# Patient Record
Sex: Male | Born: 1990 | Race: White | Hispanic: No | Marital: Single | Smoking: Never smoker
Health system: Southern US, Community
[De-identification: ages and names within clinical notes are randomized; demographics above are authoritative.]

## PROBLEM LIST (undated history)

## (undated) HISTORY — PX: OTHER SURGICAL HISTORY: SHX169

---
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
## 2011-05-14 ENCOUNTER — Emergency Department (HOSPITAL_BASED_OUTPATIENT_CLINIC_OR_DEPARTMENT_OTHER)
Admission: EM | Admit: 2011-05-14 | Discharge: 2011-05-14 | Discharge status: Against Medical Advice | Payer: Self-pay | Attending: Emergency Medicine | Admitting: Emergency Medicine

## 2011-05-14 DIAGNOSIS — Z0389 Encounter for observation for other suspected diseases and conditions ruled out: Secondary | ICD-10-CM | POA: Insufficient documentation

## 2011-09-17 LAB — I-STAT 8, (EC8 V) (CONVERTED LAB)
BUN: 12
Bicarbonate: 25 — ABNORMAL HIGH
Glucose, Bld: 164 — ABNORMAL HIGH
pCO2, Ven: 47.3
pH, Ven: 7.332 — ABNORMAL HIGH

## 2011-09-17 LAB — POCT I-STAT CREATININE: Creatinine, Ser: 0.8

## 2014-09-30 ENCOUNTER — Emergency Department (HOSPITAL_COMMUNITY)
Admission: EM | Admit: 2014-09-30 | Discharge: 2014-09-30 | Disposition: A | Discharge status: Home / Self Care | Payer: BC Managed Care – PPO | Attending: Emergency Medicine | Admitting: Emergency Medicine

## 2014-09-30 ENCOUNTER — Encounter (HOSPITAL_COMMUNITY): Payer: Self-pay

## 2014-09-30 DIAGNOSIS — T23001A Burn of unspecified degree of right hand, unspecified site, initial encounter: Secondary | ICD-10-CM | POA: Diagnosis present

## 2014-09-30 DIAGNOSIS — Y9289 Other specified places as the place of occurrence of the external cause: Secondary | ICD-10-CM | POA: Insufficient documentation

## 2014-09-30 DIAGNOSIS — T23201A Burn of second degree of right hand, unspecified site, initial encounter: Secondary | ICD-10-CM | POA: Insufficient documentation

## 2014-09-30 DIAGNOSIS — Y9389 Activity, other specified: Secondary | ICD-10-CM | POA: Insufficient documentation

## 2014-09-30 DIAGNOSIS — X030XXA Exposure to flames in controlled fire, not in building or structure, initial encounter: Secondary | ICD-10-CM | POA: Diagnosis not present

## 2014-09-30 DIAGNOSIS — T23131A Burn of first degree of multiple right fingers (nail), not including thumb, initial encounter: Secondary | ICD-10-CM | POA: Diagnosis not present

## 2014-09-30 LAB — BASIC METABOLIC PANEL
Anion gap: 12 (ref 5–15)
BUN: 13 mg/dL (ref 6–23)
CO2: 24 mEq/L (ref 19–32)
CREATININE: 0.97 mg/dL (ref 0.50–1.35)
Calcium: 9 mg/dL (ref 8.4–10.5)
Chloride: 104 mEq/L (ref 96–112)
GLUCOSE: 123 mg/dL — AB (ref 70–99)
POTASSIUM: 4.2 meq/L (ref 3.7–5.3)
Sodium: 140 mEq/L (ref 137–147)

## 2014-09-30 LAB — CBC WITH DIFFERENTIAL/PLATELET
BASOS PCT: 0 % (ref 0–1)
Basophils Absolute: 0 10*3/uL (ref 0.0–0.1)
EOS ABS: 0 10*3/uL (ref 0.0–0.7)
EOS PCT: 0 % (ref 0–5)
HCT: 43.5 % (ref 39.0–52.0)
Hemoglobin: 15.6 g/dL (ref 13.0–17.0)
LYMPHS ABS: 1.1 10*3/uL (ref 0.7–4.0)
Lymphocytes Relative: 15 % (ref 12–46)
MCH: 30.1 pg (ref 26.0–34.0)
MCHC: 35.9 g/dL (ref 30.0–36.0)
MCV: 83.8 fL (ref 78.0–100.0)
MONOS PCT: 3 % (ref 3–12)
Monocytes Absolute: 0.2 10*3/uL (ref 0.1–1.0)
Neutro Abs: 6 10*3/uL (ref 1.7–7.7)
Neutrophils Relative %: 82 % — ABNORMAL HIGH (ref 43–77)
PLATELETS: 245 10*3/uL (ref 150–400)
RBC: 5.19 MIL/uL (ref 4.22–5.81)
RDW: 12.1 % (ref 11.5–15.5)
WBC: 7.3 10*3/uL (ref 4.0–10.5)

## 2014-09-30 MED ORDER — MORPHINE SULFATE 4 MG/ML IJ SOLN
4.0000 mg | Freq: Once | INTRAMUSCULAR | Status: AC
  Administered 2014-09-30: 4 mg via INTRAVENOUS
  Filled 2014-09-30: qty 1

## 2014-09-30 MED ORDER — SILVER SULFADIAZINE 1 % EX CREA: TOPICAL_CREAM | Freq: Every day | CUTANEOUS | Status: DC

## 2014-09-30 MED ORDER — SILVER SULFADIAZINE 1 % EX CREA
TOPICAL_CREAM | Freq: Once | CUTANEOUS | Status: AC
  Administered 2014-09-30: 1 via TOPICAL
  Filled 2014-09-30: qty 85

## 2014-09-30 MED ORDER — SODIUM CHLORIDE 0.9 % IV BOLUS (SEPSIS)
1000.0000 mL | Freq: Once | INTRAVENOUS | Status: AC
  Administered 2014-09-30: 1000 mL via INTRAVENOUS

## 2014-09-30 MED ORDER — OXYCODONE-ACETAMINOPHEN 5-325 MG PO TABS: 1.0000 | ORAL_TABLET | ORAL | Status: DC | PRN

## 2014-09-30 NOTE — ED Notes (Signed)
TRIAGE ASSESSMENT AND FOCUSED ASSESSMENTS CHARTED DURING DOWNTIME.  

## 2014-09-30 NOTE — ED Notes (Signed)
Pt A&OX4, ambulatory at d/c with steady gait, NAD 

## 2014-09-30 NOTE — ED Notes (Signed)
Pt A&Ox4, ambulatory at d/c with steady gait, NAD 
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
## 2014-09-30 NOTE — Discharge Instructions (Signed)
Burn Care Your skin is a natural barrier to infection. It is the largest organ of your body. Burns damage this natural protection. To help prevent infection, it is very important to follow your caregiver's instructions in the care of your burn. Burns are classified as:  First degree. There is only redness of the skin (erythema). No scarring is expected.  Second degree. There is blistering of the skin. Scarring may occur with deeper burns.  Third degree. All layers of the skin are injured, and scarring is expected. HOME CARE INSTRUCTIONS   Wash your hands well before changing your bandage.  Change your bandage as often as directed by your caregiver.  Remove the old bandage. If the bandage sticks, you may soak it off with cool, clean water.  Cleanse the burn thoroughly but gently with mild soap and water.  Pat the area dry with a clean, dry cloth.  Apply a thin layer of antibacterial cream to the burn.  Apply a clean bandage as instructed by your caregiver.  Keep the bandage as clean and dry as possible.  Elevate the affected area for the first 24 hours, then as instructed by your caregiver.  Only take over-the-counter or prescription medicines for pain, discomfort, or fever as directed by your caregiver. SEEK IMMEDIATE MEDICAL CARE IF:   You develop excessive pain.  You develop redness, tenderness, swelling, or red streaks near the burn.  The burned area develops yellowish-white fluid (pus) or a bad smell.  You have a fever. MAKE SURE YOU:   Understand these instructions.  Will watch your condition.  Will get help right away if you are not doing well or get worse. Document Released: 11/16/2005 Document Revised: 02/08/2012 Document Reviewed: 04/08/2011 [employer] Patient Information 2015 [employer]. This information is not intended to replace advice given to you by your health care provider. Make sure you discuss any questions you have with your health care  provider.  

## 2016-09-26 ENCOUNTER — Emergency Department (HOSPITAL_COMMUNITY): Payer: Self-pay

## 2016-09-26 ENCOUNTER — Encounter (HOSPITAL_COMMUNITY): Payer: Self-pay | Admitting: Emergency Medicine

## 2016-09-26 ENCOUNTER — Emergency Department (HOSPITAL_COMMUNITY)
Admission: EM | Admit: 2016-09-26 | Discharge: 2016-09-26 | Disposition: A | Discharge status: Home / Self Care | Payer: Self-pay | Attending: Emergency Medicine | Admitting: Emergency Medicine

## 2016-09-26 DIAGNOSIS — S61216A Laceration without foreign body of right little finger without damage to nail, initial encounter: Secondary | ICD-10-CM | POA: Insufficient documentation

## 2016-09-26 DIAGNOSIS — W010XXA Fall on same level from slipping, tripping and stumbling without subsequent striking against object, initial encounter: Secondary | ICD-10-CM | POA: Insufficient documentation

## 2016-09-26 DIAGNOSIS — Y939 Activity, unspecified: Secondary | ICD-10-CM | POA: Insufficient documentation

## 2016-09-26 DIAGNOSIS — S41111A Laceration without foreign body of right upper arm, initial encounter: Secondary | ICD-10-CM | POA: Insufficient documentation

## 2016-09-26 DIAGNOSIS — S61214A Laceration without foreign body of right ring finger without damage to nail, initial encounter: Secondary | ICD-10-CM | POA: Insufficient documentation

## 2016-09-26 DIAGNOSIS — Y999 Unspecified external cause status: Secondary | ICD-10-CM | POA: Insufficient documentation

## 2016-09-26 DIAGNOSIS — Y9289 Other specified places as the place of occurrence of the external cause: Secondary | ICD-10-CM | POA: Insufficient documentation

## 2016-09-26 MED ORDER — LIDOCAINE HCL (PF) 1 % IJ SOLN
20.0000 mL | Freq: Once | INTRAMUSCULAR | Status: AC
  Administered 2016-09-26: 1 mL
  Filled 2016-09-26: qty 30

## 2016-09-26 MED ORDER — BACITRACIN ZINC 500 UNIT/GM EX OINT: 1.0000 "application " | TOPICAL_OINTMENT | Freq: Two times a day (BID) | CUTANEOUS | 1 refills | Status: AC

## 2016-09-26 MED ORDER — LIDOCAINE-EPINEPHRINE (PF) 2 %-1:200000 IJ SOLN
10.0000 mL | Freq: Once | INTRAMUSCULAR | Status: AC
  Administered 2016-09-26: 1 mL via INTRADERMAL
  Filled 2016-09-26: qty 20

## 2016-09-26 MED ORDER — BACITRACIN ZINC 500 UNIT/GM EX OINT
TOPICAL_OINTMENT | CUTANEOUS | Status: AC
  Filled 2016-09-26: qty 0.9

## 2016-09-26 MED ORDER — NAPROXEN 250 MG PO TABS: 250.0000 mg | ORAL_TABLET | Freq: Two times a day (BID) | ORAL | 0 refills | Status: AC
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
## 2022-03-11 ENCOUNTER — Emergency Department (HOSPITAL_COMMUNITY)
Admission: EM | Admit: 2022-03-11 | Discharge: 2022-03-12 | Disposition: A | Discharge status: Home / Self Care | Payer: Self-pay | Attending: Emergency Medicine | Admitting: Emergency Medicine

## 2022-03-11 ENCOUNTER — Emergency Department (HOSPITAL_COMMUNITY): Payer: Self-pay

## 2022-03-11 DIAGNOSIS — R21 Rash and other nonspecific skin eruption: Secondary | ICD-10-CM | POA: Diagnosis not present

## 2022-03-11 DIAGNOSIS — M25512 Pain in left shoulder: Secondary | ICD-10-CM | POA: Insufficient documentation

## 2022-03-11 DIAGNOSIS — M79641 Pain in right hand: Secondary | ICD-10-CM | POA: Insufficient documentation

## 2022-03-11 DIAGNOSIS — S60031A Contusion of right middle finger without damage to nail, initial encounter: Secondary | ICD-10-CM | POA: Diagnosis not present

## 2022-03-11 DIAGNOSIS — T07XXXA Unspecified multiple injuries, initial encounter: Secondary | ICD-10-CM

## 2022-03-11 DIAGNOSIS — S6991XA Unspecified injury of right wrist, hand and finger(s), initial encounter: Secondary | ICD-10-CM | POA: Diagnosis present

## 2022-03-11 MED ORDER — OXYCODONE-ACETAMINOPHEN 5-325 MG PO TABS
1.0000 | ORAL_TABLET | Freq: Once | ORAL | Status: AC
  Administered 2022-03-11: 1 via ORAL
  Filled 2022-03-11: qty 1
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
## 2022-03-12 ENCOUNTER — Encounter (HOSPITAL_COMMUNITY): Payer: Self-pay

## 2022-03-12 ENCOUNTER — Other Ambulatory Visit: Payer: Self-pay

## 2022-03-12 MED ORDER — OXYCODONE-ACETAMINOPHEN 5-325 MG PO TABS: 1.0000 | ORAL_TABLET | ORAL | 0 refills | Status: AC | PRN

## 2022-03-12 NOTE — ED Notes (Signed)
All discharge instructions including follow up care and prescriptions reviewed with patient and patient verbalized understanding of same. Patient stable and ambulatory at time of discharge.  

## 2022-03-12 NOTE — ED Notes (Signed)
This RN applied finer splint to patients right middle finger. Abrasions to right hand and fingers cleaned with sterile water and soap prior to splint application,. Patients right hand also wrapped in kerlix and non stick gauze applied to abrasions. Patient tolerated well.  ?

## 2022-03-12 NOTE — Discharge Instructions (Signed)
Take the prescribed medication as directed.  Wear sling and splint for now.   ?Follow-up with orthopedics if symptoms not improving-- call office for appt. ?Return to the ED for new or worsening symptoms. ?

## 2022-11-25 IMAGING — DX DG SHOULDER 2+V*L*
3 series · 3 of 3 positions shown · non-contrast
Comparison: None.

CLINICAL DATA: Fall

EXAM:
LEFT SHOULDER - 2+ VIEW

[shoulder grashey]
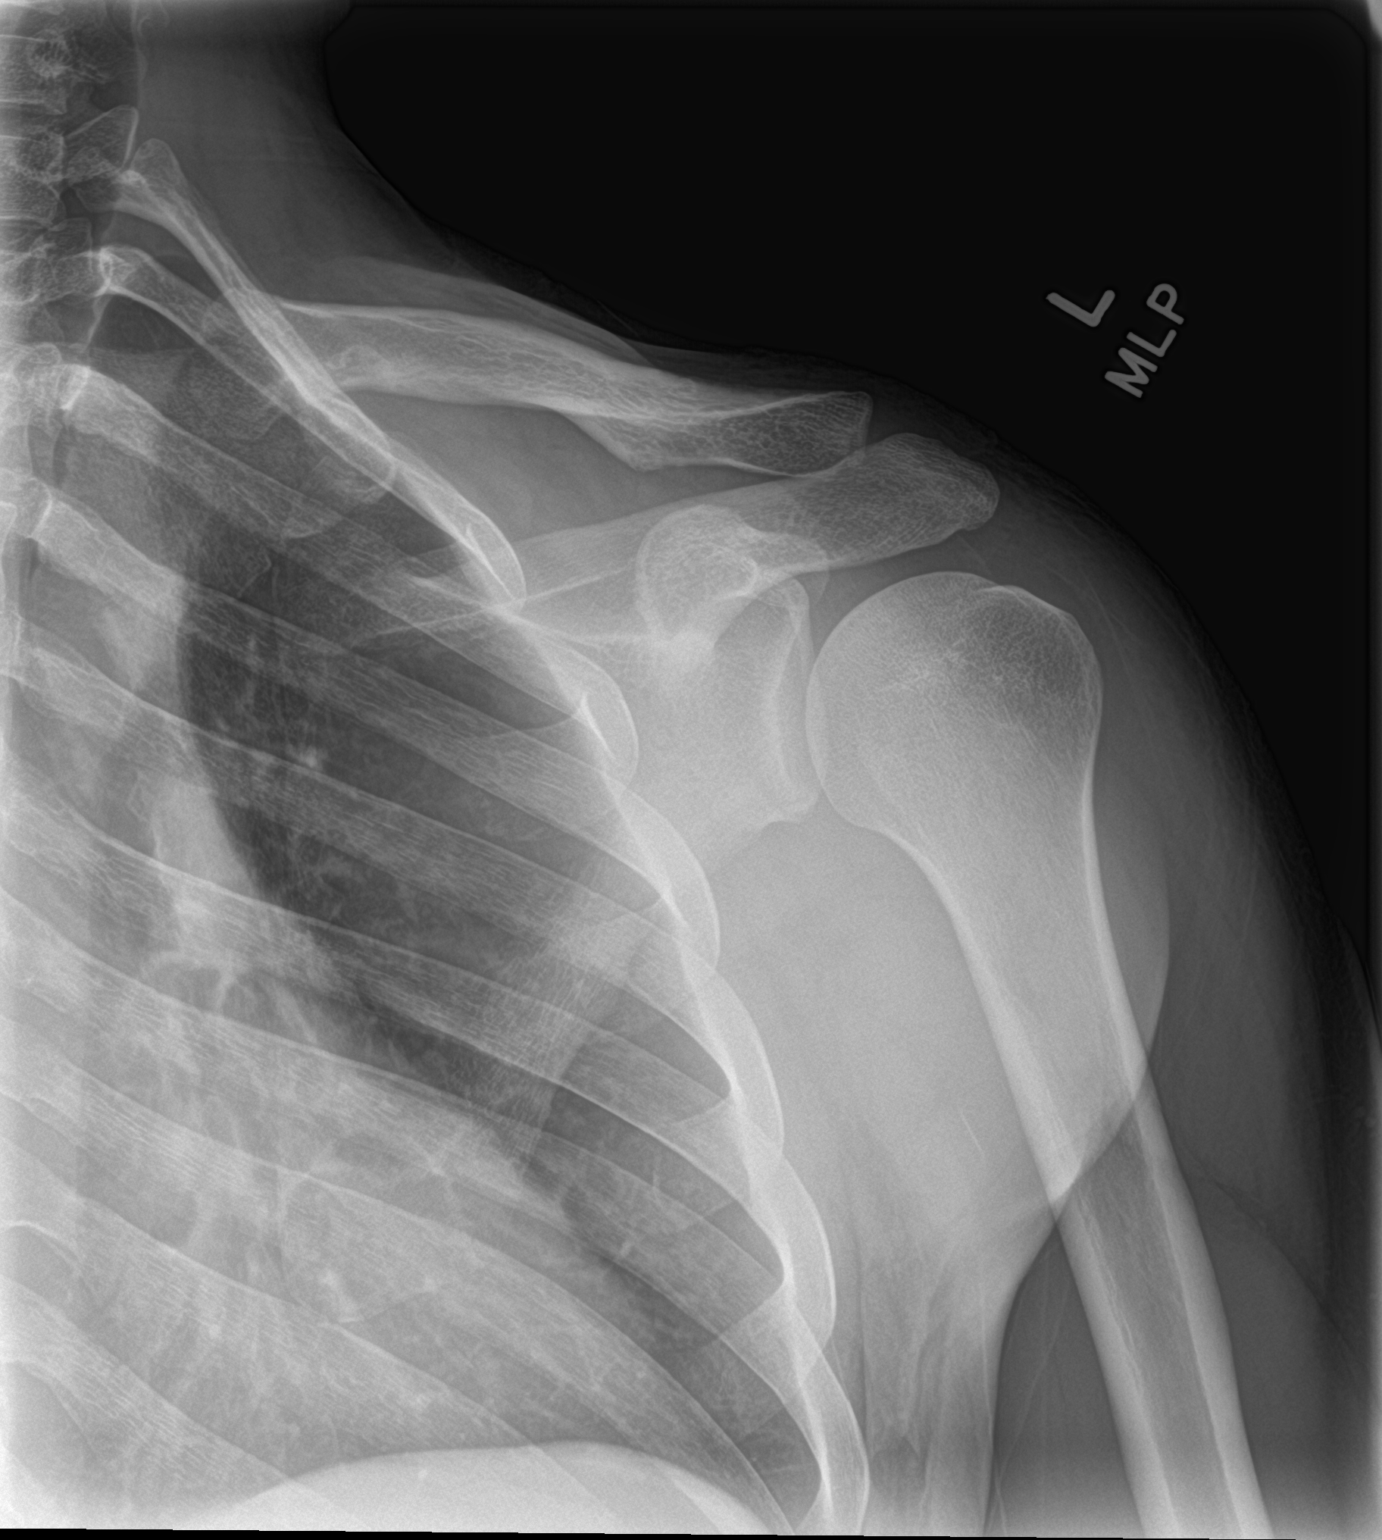

[shoulder y view]
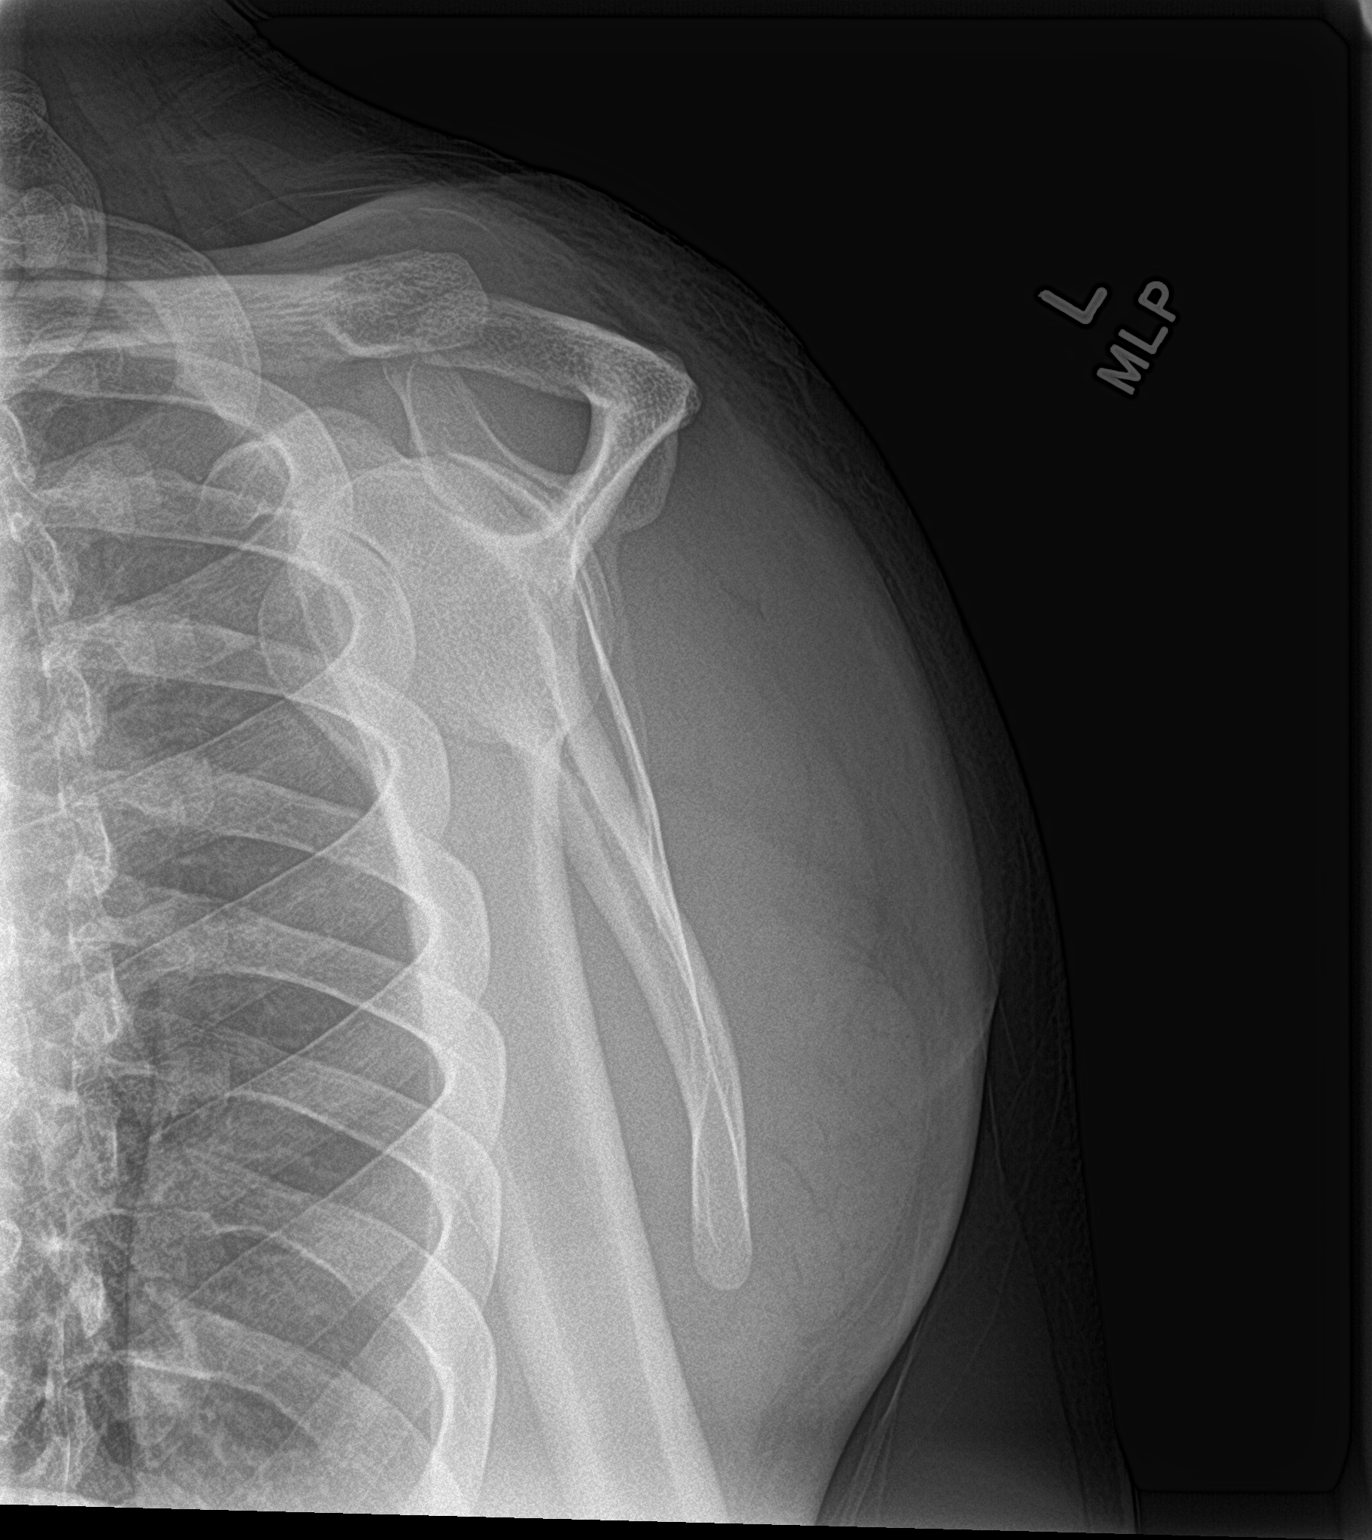

[shoulder ap neutral]
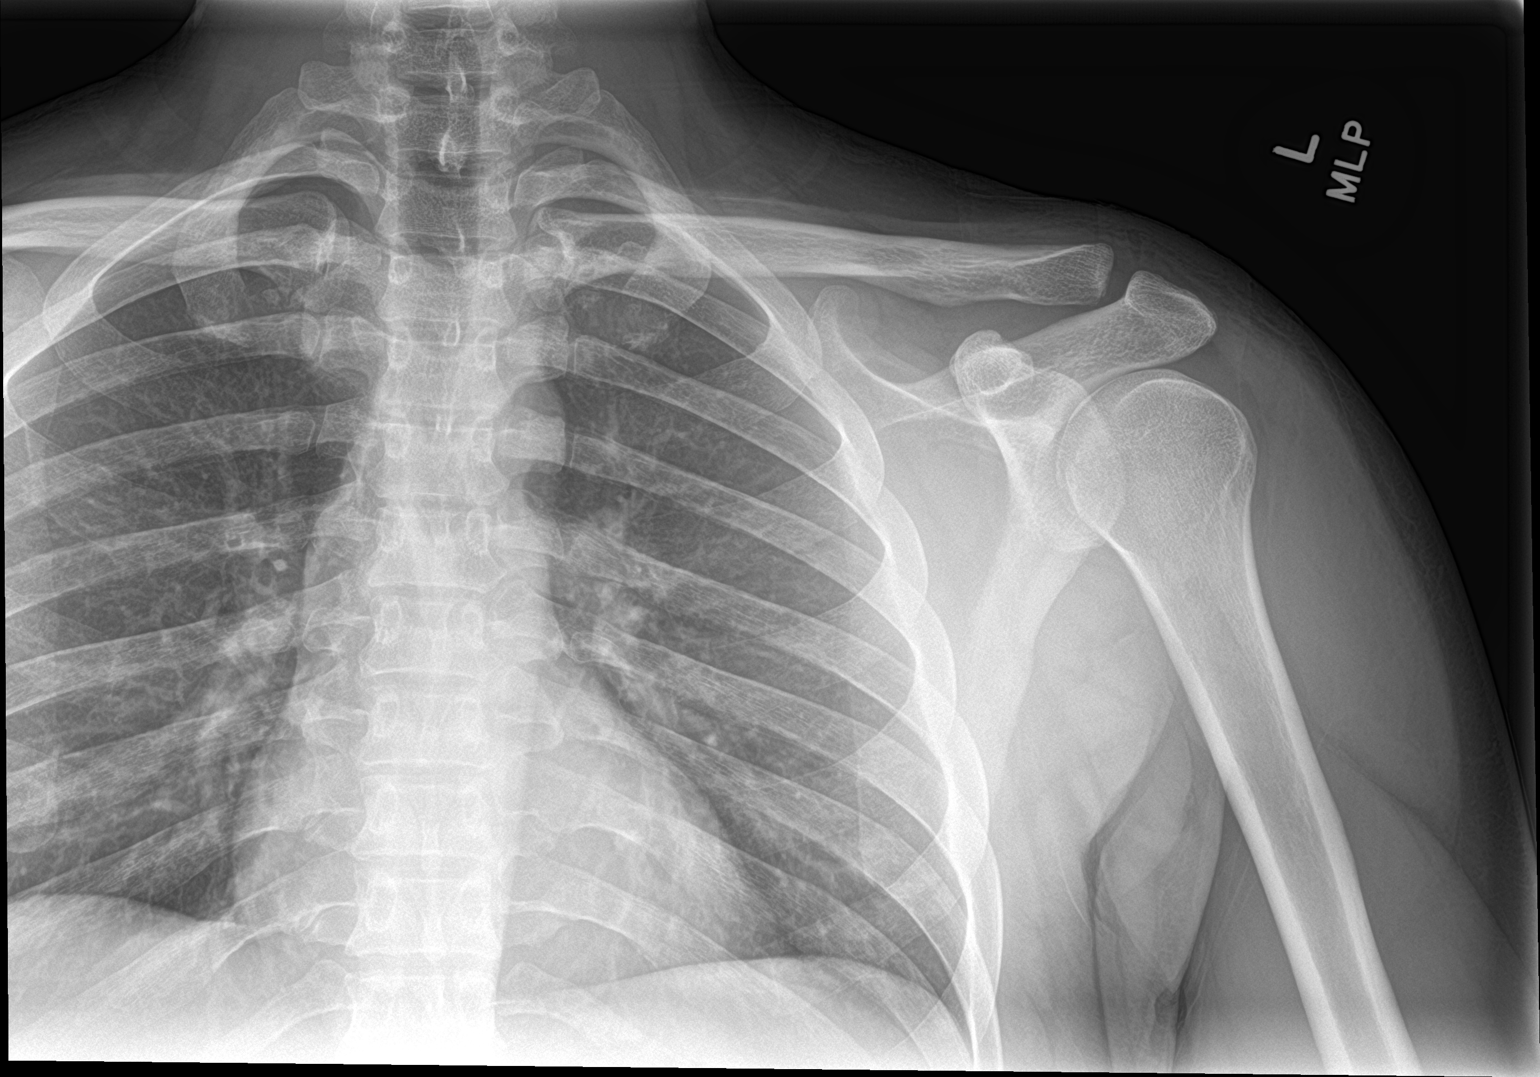

[3 of 3 positions shown; findings below may reference images not displayed]

FINDINGS: There is no evidence of fracture or dislocation. There is no
evidence of arthropathy or other focal bone abnormality. Soft
tissues are unremarkable.
IMPRESSION: Negative.

## 2024-12-01 ENCOUNTER — Other Ambulatory Visit: Payer: Self-pay

## 2024-12-01 ENCOUNTER — Encounter (HOSPITAL_BASED_OUTPATIENT_CLINIC_OR_DEPARTMENT_OTHER): Payer: Self-pay | Admitting: Emergency Medicine

## 2024-12-01 ENCOUNTER — Emergency Department (HOSPITAL_BASED_OUTPATIENT_CLINIC_OR_DEPARTMENT_OTHER)
Admission: EM | Admit: 2024-12-01 | Discharge: 2024-12-02 | Disposition: A | Discharge status: Home / Self Care | Payer: Self-pay | Attending: Emergency Medicine | Admitting: Emergency Medicine

## 2024-12-01 DIAGNOSIS — Z2914 Encounter for prophylactic rabies immune globin: Secondary | ICD-10-CM | POA: Insufficient documentation

## 2024-12-01 DIAGNOSIS — Z203 Contact with and (suspected) exposure to rabies: Secondary | ICD-10-CM | POA: Insufficient documentation

## 2024-12-01 DIAGNOSIS — Z79899 Other long term (current) drug therapy: Secondary | ICD-10-CM | POA: Insufficient documentation

## 2024-12-01 LAB — CBG MONITORING, ED: Glucose-Capillary: 129 mg/dL — ABNORMAL HIGH (ref 70–99)

## 2024-12-01 MED ORDER — TETANUS-DIPHTH-ACELL PERTUSSIS 5-2-15.5 LF-MCG/0.5 IM SUSP: 0.5000 mL | Freq: Once | INTRAMUSCULAR | Status: DC

## 2024-12-01 MED ORDER — RABIES VIRUS VACCINE, HDC IM SUSR
1.0000 mL | Freq: Once | INTRAMUSCULAR | Status: AC
  Administered 2024-12-01: 1 mL via INTRAMUSCULAR
  Filled 2024-12-01: qty 1

## 2024-12-01 MED ORDER — SODIUM CHLORIDE 0.9 % IV BOLUS
1000.0000 mL | Freq: Once | INTRAVENOUS | Status: AC
  Administered 2024-12-01: 1000 mL via INTRAVENOUS

## 2024-12-01 MED ORDER — RABIES IMMUNE GLOBULIN 1500 UNIT/10ML IJ SOLN
20.0000 [IU]/kg | Freq: Once | INTRAMUSCULAR | Status: AC
  Administered 2024-12-01: 1500 [IU] via INTRAMUSCULAR
  Filled 2024-12-01: qty 50

## 2024-12-01 NOTE — ED Notes (Signed)
 Pt reported feeling dizzy after injection and then proceeded to have syncopal spell and eyes rolled back and he began to slide out of chair. This RN caught him and other staff called to help. Pt aware of surroundings but asking what happened when he regained consciousness. Vitals updated and provider at bedside. IV started and pt will be moved to room for monitoring.

## 2024-12-01 NOTE — ED Provider Notes (Signed)
 " [place] EMERGENCY DEPARTMENT AT [facility] Provider Note   CSN: [id number] Arrival date & time: 12/01/24  8079     Patient presents with: Raccon Encounter   Jose Owens is a 34 y.o. male presents with concern for possible exposure to rabies.  He reports that he was hunting yesterday and shot a raccoon that was acting weird.  He touched the raccoon's blood with his hands.  He does not know of any scratches or bite marks.  Animal control has the raccoon for testing, but he does not know if it tested positive or negative for rabies yet.    HPI     Prior to Admission medications  Medication Sig Start Date End Date Taking? Authorizing Provider  bacitracin  ointment Apply 1 application topically 2 (two) times daily. Patient not taking: Reported on 03/12/2022 09/26/16   Dansie, William, PA-C  doxycycline (VIBRA-TABS) 100 MG tablet Take 100 mg by mouth daily. Patient not taking: Reported on 03/12/2022    [provider]  ibuprofen (ADVIL) 200 MG tablet Take 600 mg by mouth every 6 (six) hours as needed for moderate pain.    [provider]  naproxen  (NAPROSYN ) 250 MG tablet Take 1 tablet (250 mg total) by mouth 2 (two) times daily with a meal. Patient not taking: Reported on 03/12/2022 09/26/16   Dansie, William, PA-C  oxyCODONE -acetaminophen  (PERCOCET) 5-325 MG tablet Take 1 tablet by mouth every 4 (four) hours as needed. 03/12/22   Jarold Olam CHRISTELLA, PA-C    Allergies: Patient has no known allergies.    Review of Systems  Skin:  Negative for wound.    Updated Vital Signs BP (!) 156/89   Pulse 82   Temp 98.8 F (37.1 C)   Resp 16   Ht 5' 5 (1.651 m)   Wt 79.4 kg   SpO2 98%   BMI 29.12 kg/m   Physical Exam Vitals and nursing note reviewed.  Constitutional:      Appearance: Normal appearance.  HENT:     Head: Atraumatic.  Pulmonary:     Effort: Pulmonary effort is normal.  Skin:    Comments: Hands and arms bilaterally without any bite or  scratch marks noted.  No erythema or edema.  Neurological:     General: No focal deficit present.     Mental Status: He is alert.  Psychiatric:        Mood and Affect: Mood normal.        Behavior: Behavior normal.     (all labs ordered are listed, but only abnormal results are displayed) Labs Reviewed - No data to display  EKG: None  Radiology: No results found.   Procedures   Medications Ordered in the ED  Rabies Immune Globulin  SOLN 1,500 Units (has no administration in time range)  rabies vaccine , human diploid (IMOVAX) injection 1 mL (has no administration in time range)  Tdap (ADACEL) injection 0.5 mL (has no administration in time range)                                    Medical Decision Making Risk Prescription drug management.     Differential diagnosis includes but is not limited to rabies exposure, cellulitis, tetanus exposure  ED Course:  Upon initial evaluation, patient is very well-appearing.  Reporting possible exposure to rabies yesterday as he touched a raccoon's blood with his bare hands. He reports the raccoon  was acting drunk and weird. No bite or scratch marks noted to the patient's hands today.  No erythema or edema to suggest cellulitis or other infection. Will cover for possible rabies exposure with giving patient the rabies immunoglobulin and rabies vaccine  here today.  Will also give patient Tdap.  Stable and appropriate for discharge home at this time.  Medications Given: Rabies immunoglobulin Rabies vaccine  Tdap   Impression: Possible rabies exposure  Disposition:  The patient was discharged home with instructions to return on day 3, 7, and 14 for the remainder of the rabies series. He does not need the remainder of the series if the animal tests negative for rabies.  Return precautions given and patient verbalized understanding.     This chart was dictated using voice recognition software, Dragon. Despite the best efforts  of this provider to proofread and correct errors, errors may still occur which can change documentation meaning.       Final diagnoses:  Contact with and suspected exposure to rabies    ED Discharge Orders     None          Veta Palma, DEVONNA 12/01/24 2143    Patsey Lot, MD 12/04/24 1503  "

## 2024-12-01 NOTE — ED Provider Notes (Addendum)
" °  Physical Exam   Vitals:   12/01/24 2315 12/01/24 2330 12/01/24 2342 12/01/24 2345  BP: 120/67 130/85 117/79 127/79  Pulse: (!) 43 (!) 49 (!) 48 64  Resp:      Temp:      SpO2: 99% 95% 100% 100%  Weight:      Height:         Physical Exam  Procedures  Procedures  ED Course / MDM    Medical Decision Making Risk Prescription drug management.   Patient was discharged by previous EDP Alan Harari after administration of rabies vaccine  series following recent exposure to raccoon. When nursing staff went to discharge the patient, he had what appears to be a vasovagal reaction, where he briefly lost consciousness and slid out of chair to the floor. No head injury, patient was then found to be bradycardic in the 30s with borderline hypotension, I was asked to evaluate the patient at the bedside. Patient reports history of vasovagal reaction after blood draw previously, patient is diaphoretic and pale at time of my assessment, denies pain but reports feeling thirsty, no nausea/vomiting. Patient given water which he tolerated well. He is able to answer questions appropriately.  Will reassess after IVF bolus. CBG normal.  At time of my reassessment, patient notes symptomatic improvement, heart rate and BP has improved.  Symptoms likely representative of vasovagal syncope, history of the same.  Patient will be appropriate for discharge after completion of IV fluid bolus/following complete administration of rabies vaccination series.          Tykeria Wawrzyniak N, PA-C 12/01/24 2356    Patsey Lot, MD 12/04/24 1503  "

## 2024-12-01 NOTE — Discharge Instructions (Addendum)
 You have been given the first dose of the rabies vaccine series here today.  Please return on 12/05/2023, 12/09/2023, and 12/16/2023 for the remainder of your vaccine series if the raccoon test positive for rabies.  If the raccoon does not test positive for rabies, you do not need the remainder of the vaccine series.  If you have a PCP, you can also notify them and have them order the remainder for the vaccine series for you.  Your tetanus has been updated today  Please return to the ER for any other new or concerning symptoms

## 2024-12-01 NOTE — ED Triage Notes (Signed)
 Patient reports he touched an arrow after shooting a raccoon yesterday that he reports having bizarre behavior.  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 Patient advises that he did touch the arrow with his bare hands. Patient gave raccoon to animal control, and contacted [facility] who advised him to come to ER for rabies series.

## 2024-12-02 NOTE — ED Notes (Addendum)
 Reviewed discharge instructions and follow-up care for remainder of rabies vaccines with pt. Pt verbalized understanding and had no further questions. Pt exited ED without complications.

## 2024-12-05 ENCOUNTER — Ambulatory Visit
Admission: EM | Admit: 2024-12-05 | Discharge: 2024-12-05 | Disposition: A | Discharge status: Home / Self Care | Payer: Self-pay | Attending: Family Medicine | Admitting: Family Medicine

## 2024-12-05 DIAGNOSIS — Z23 Encounter for immunization: Secondary | ICD-10-CM

## 2024-12-05 MED ORDER — RABIES VIRUS VACCINE, HDC IM SUSR
1.0000 mL | Freq: Once | INTRAMUSCULAR | Status: AC
  Administered 2024-12-05: 1 mL via INTRAMUSCULAR

## 2024-12-05 MED FILL — Rabies Immune Globulin (Human) Inj 300 Unit/2ML (150 Unt/ML): INTRAMUSCULAR | Qty: 10 | Status: AC

## 2024-12-05 NOTE — ED Triage Notes (Signed)
 Pt reports to UC for 2nd rabies shot.

## 2024-12-08 ENCOUNTER — Ambulatory Visit
Admission: RE | Admit: 2024-12-08 | Discharge: 2024-12-08 | Disposition: A | Discharge status: Home / Self Care | Payer: Self-pay | Attending: Physician Assistant | Admitting: Physician Assistant

## 2024-12-08 VITALS — BP 133/76 | HR 66 | Temp 97.9°F | Resp 16

## 2024-12-08 DIAGNOSIS — Z203 Contact with and (suspected) exposure to rabies: Secondary | ICD-10-CM

## 2024-12-08 DIAGNOSIS — Z23 Encounter for immunization: Secondary | ICD-10-CM

## 2024-12-08 MED ORDER — RABIES VIRUS VACCINE, HDC IM SUSR
1.0000 mL | Freq: Once | INTRAMUSCULAR | Status: AC
  Administered 2024-12-08: 1 mL via INTRAMUSCULAR

## 2024-12-08 NOTE — ED Triage Notes (Signed)
Here for 3rd rabies vaccine.

## 2024-12-15 ENCOUNTER — Ambulatory Visit
Admission: RE | Admit: 2024-12-15 | Discharge: 2024-12-15 | Disposition: A | Discharge status: Home / Self Care | Payer: Self-pay | Source: Ambulatory Visit | Attending: Nurse Practitioner | Admitting: Nurse Practitioner

## 2024-12-15 DIAGNOSIS — Z23 Encounter for immunization: Secondary | ICD-10-CM

## 2024-12-15 MED ORDER — RABIES VACCINE, PCEC IM SUSR
1.0000 mL | Freq: Once | INTRAMUSCULAR | Status: AC
  Administered 2024-12-15: 1 mL via INTRAMUSCULAR

## 2024-12-15 NOTE — ED Triage Notes (Signed)
 Pt reports to UC for final rabies series.
